# Patient Record
Sex: Male | Born: 2000 | Race: White | Hispanic: No | Marital: Single | Smoking: Never smoker
Health system: Southern US, Community
[De-identification: ages and names within clinical notes are randomized; demographics above are authoritative.]

## PROBLEM LIST (undated history)

## (undated) DIAGNOSIS — F419 Anxiety disorder, unspecified: Secondary | ICD-10-CM

## (undated) DIAGNOSIS — F32A Depression, unspecified: Secondary | ICD-10-CM

## (undated) DIAGNOSIS — J309 Allergic rhinitis, unspecified: Secondary | ICD-10-CM

## (undated) HISTORY — DX: Anxiety disorder, unspecified: F41.9

## (undated) HISTORY — DX: Allergic rhinitis, unspecified: J30.9

## (undated) HISTORY — DX: Depression, unspecified: F32.A

---
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
## 2018-03-24 IMAGING — DX DG ABDOMEN ACUTE W/ 1V CHEST
4 series · 4 of 4 positions shown · non-contrast
Comparison: None.

CLINICAL DATA: Upper abdominal pain.

EXAM:
DG ABDOMEN ACUTE W/ 1V CHEST

[chest pa]
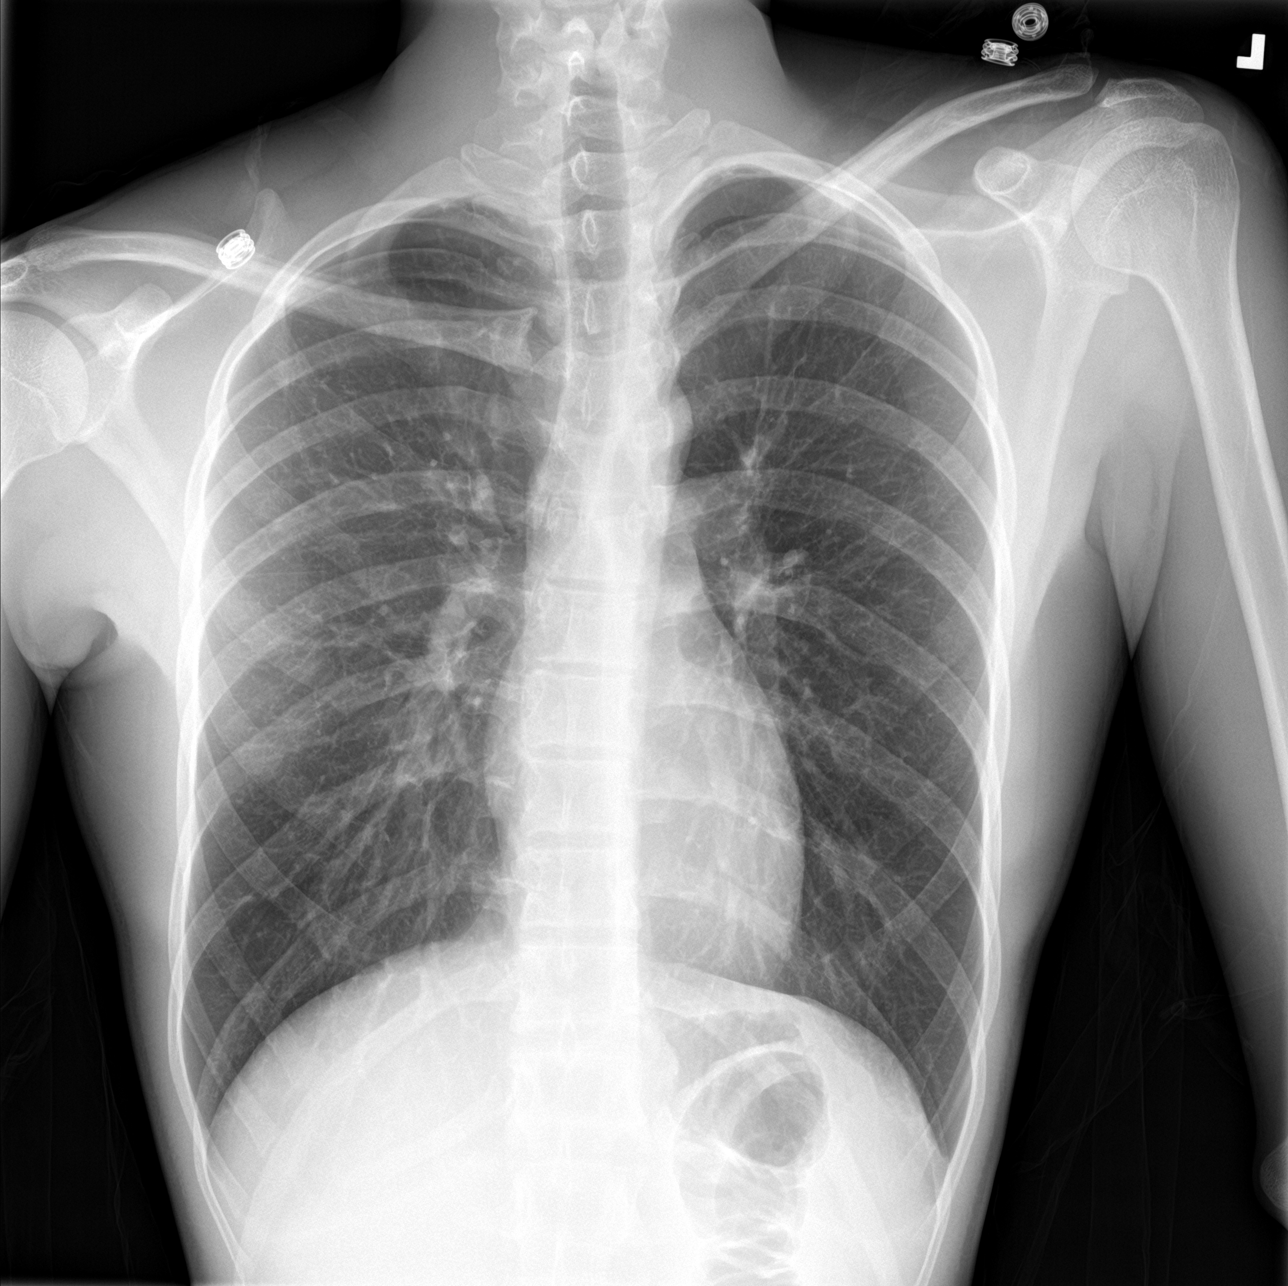

[abdomen erect]
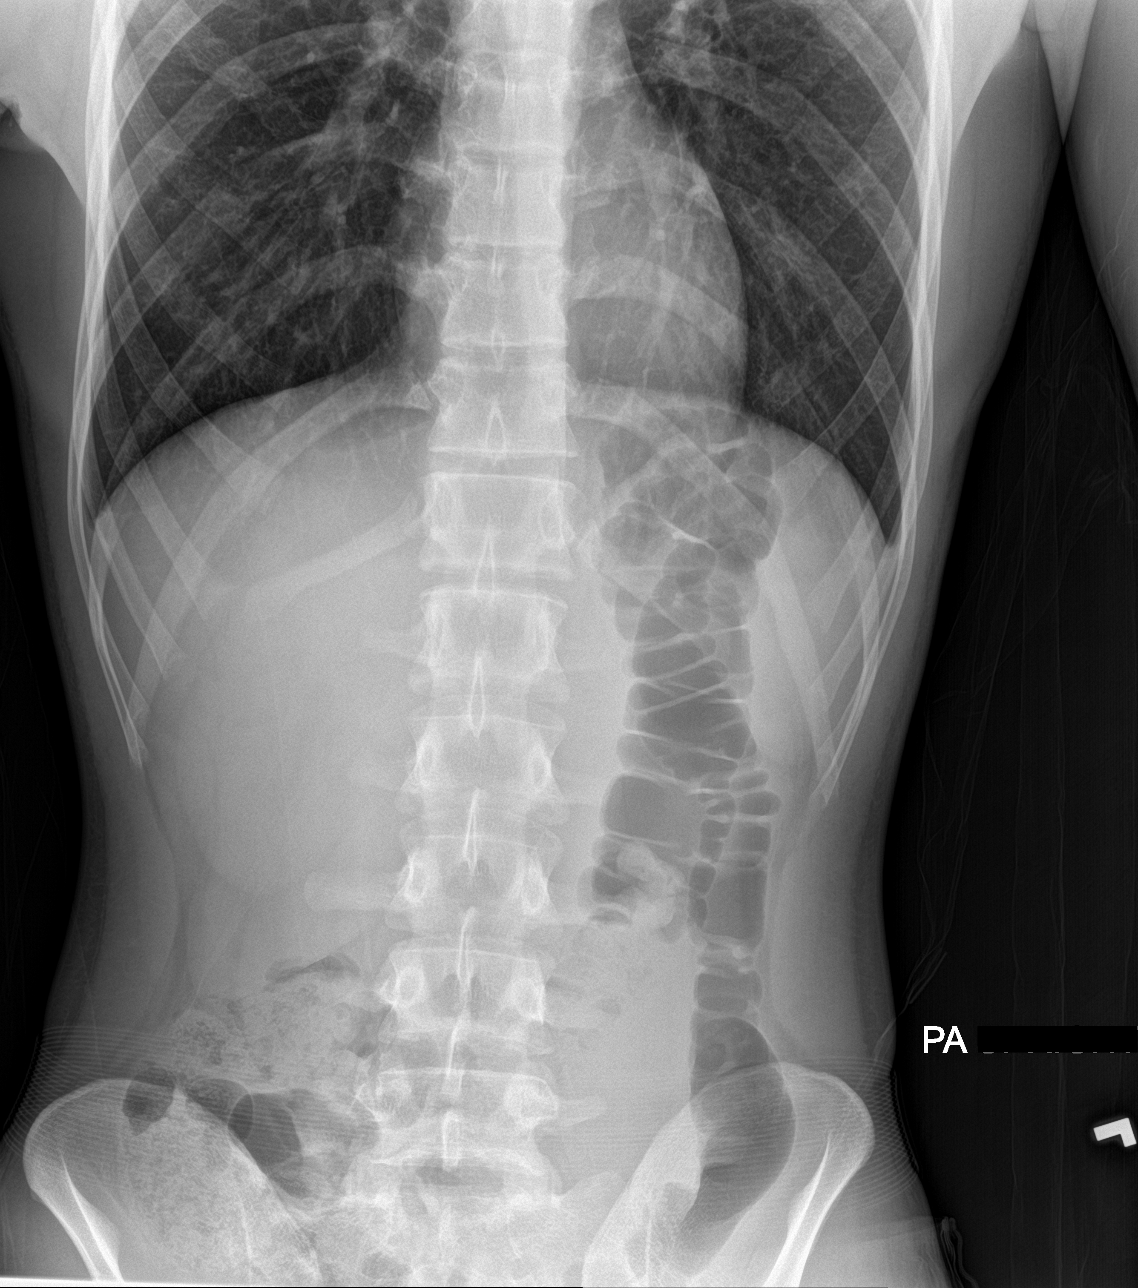

[abdomen supine (1 of 2)]
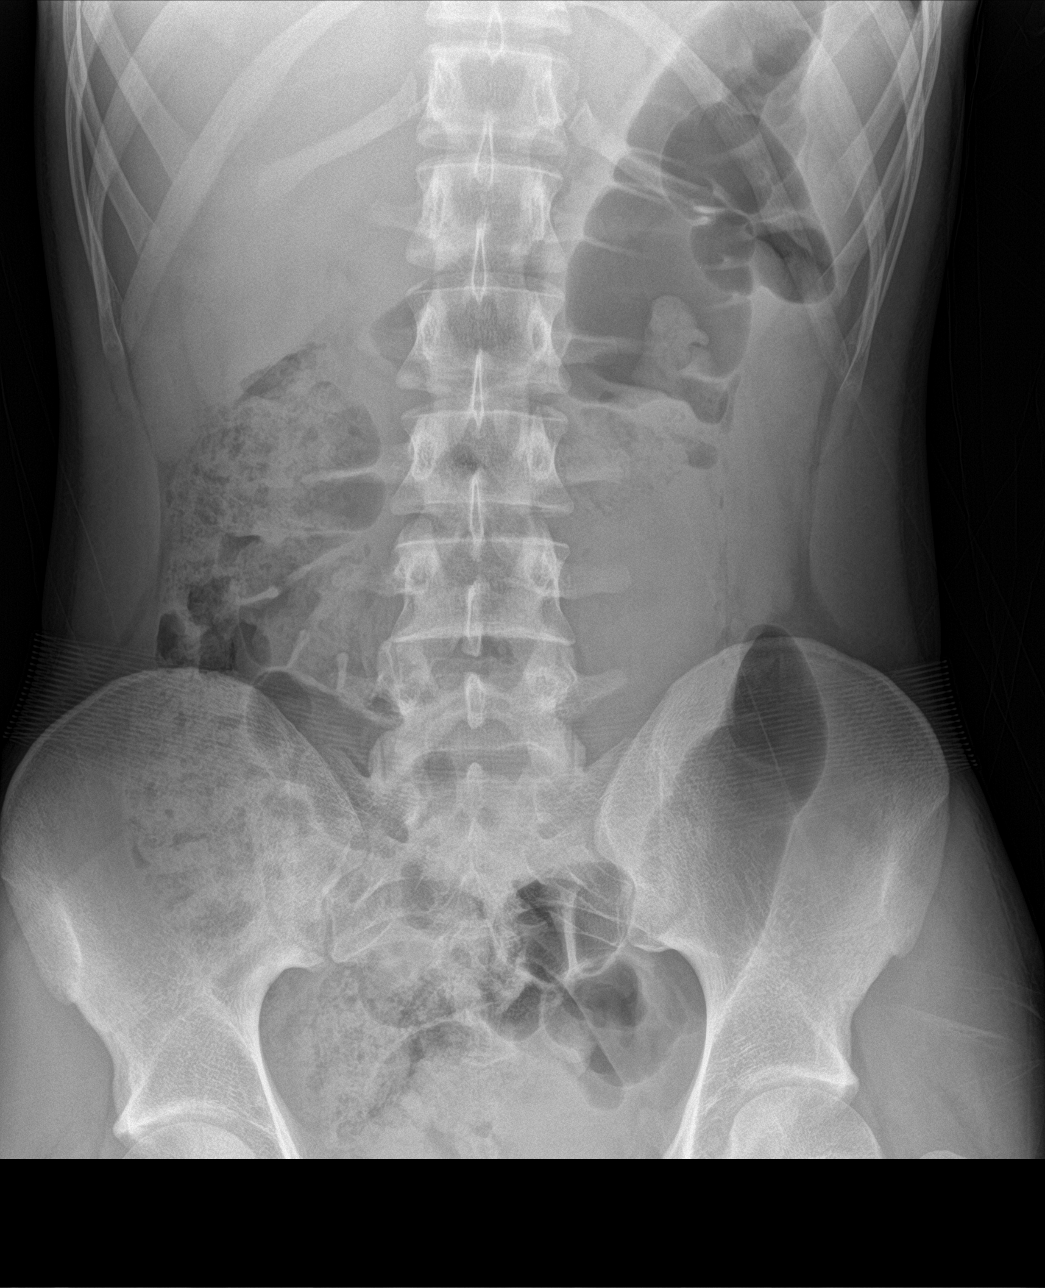

[abdomen supine (2 of 2)]
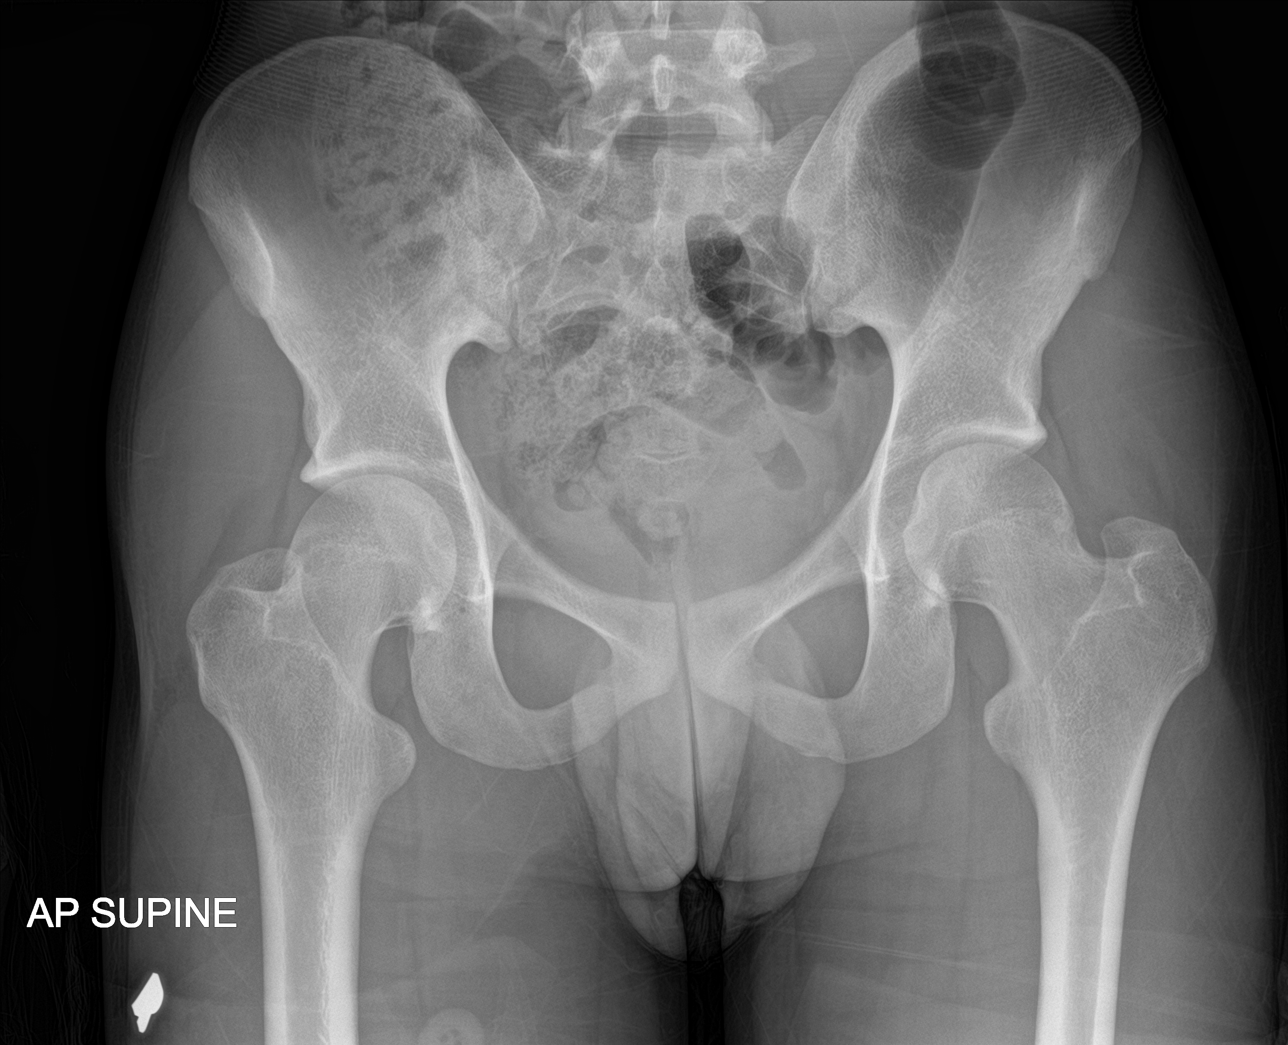

[4 of 4 positions shown; findings below may reference images not displayed]

FINDINGS: The lungs are clear wiithout focal pneumonia, edema, pneumothorax or
pleural effusion. The cardiopericardial silhouette is within normal
limits for size. The visualized bony structures of the thorax are
intact.Upright film shows no evidence for intraperitoneal free air.
No gaseous small bowel dilatation. Prominent stool noted right colon
with mild diffuse gaseous colonic distention.
IMPRESSION: 1. Normal chest x-ray.
2. Prominent stool right colon.  Nonspecific bowel gas pattern.

## 2020-04-13 ENCOUNTER — Encounter: Payer: Self-pay | Admitting: Family Medicine

## 2020-04-13 ENCOUNTER — Other Ambulatory Visit: Payer: Self-pay

## 2020-04-13 ENCOUNTER — Ambulatory Visit: Payer: Medicaid Other | Admitting: Family Medicine

## 2020-04-13 VITALS — BP 134/79 | HR 68 | Temp 97.7°F | Ht 69.0 in | Wt 147.4 lb

## 2020-04-13 DIAGNOSIS — G4700 Insomnia, unspecified: Secondary | ICD-10-CM | POA: Diagnosis not present

## 2020-04-13 DIAGNOSIS — Z7689 Persons encountering health services in other specified circumstances: Secondary | ICD-10-CM

## 2020-04-13 DIAGNOSIS — F321 Major depressive disorder, single episode, moderate: Secondary | ICD-10-CM

## 2020-04-13 MED ORDER — CITALOPRAM HYDROBROMIDE 20 MG PO TABS: 20.0000 mg | ORAL_TABLET | Freq: Every day | ORAL | 5 refills | Status: DC

## 2020-04-13 NOTE — Patient Instructions (Signed)

## 2020-04-13 NOTE — Progress Notes (Signed)
New Patient Office Visit  Subjective:  Patient ID: Randy Huang, male    DOB: 2000/06/24  Age: 19 y.o. MRN: [id number]  CC:  Chief Complaint  Patient presents with  . New Patient (Initial Visit)    HPI LIANDRO THELIN presents to re-establish care and discuss depression.  1. Depression Deaglan reports a history of depression. He reports that he feels like he always has symptoms of depression and has periods where his symptoms are worse. He denies any recent precipitating events. He has tried lexapro a few years ago without improvement. He has not tried medication since. He is interested in trying medication again. He is not interested in therapy at this time. He also reports that he does not sleep well 3-4 nights a week. On these night he gets about 3-4 hours of sleep. Other nights he sleeps well. He has tried melatonin in the past. He has not tried any remedies recently.   Depression screen [facility] 2/9 04/13/2020 07/04/2016 06/18/2016  Decreased Interest 2 0 0  Down, Depressed, Hopeless 1 0 0  PHQ - 2 Score 3 0 0  Altered sleeping 3 0 0  Tired, decreased energy 1 0 0  Change in appetite 3 0 0  Feeling bad or failure about yourself  2 0 0  Trouble concentrating 0 0 0  Moving slowly or fidgety/restless 2 0 0  Suicidal thoughts 0 0 0  PHQ-9 Score 14 0 0  Difficult doing work/chores Somewhat difficult - -      Past Medical History:  Diagnosis Date  . Allergic rhinitis 12/05/2012  . Anxiety   . Depression     History reviewed. No pertinent surgical history.  Family History  Problem Relation Age of Onset  . Hyperlipidemia Mother        mild  . Migraines Mother   . Diabetes Father   . Hypertension Father   . Hyperlipidemia Father   . Sleep apnea Father   . Cancer Father        cancer  . Healthy Brother   . Thyroid disease Maternal Grandmother   . Hyperlipidemia Maternal Grandmother   . Hypertension Maternal Grandmother   . Diabetes Maternal Grandfather   . Cancer  Maternal Grandfather        skin  . Hypertension Maternal Grandfather   . Heart disease Maternal Grandfather   . Cancer Paternal Grandmother        breast  . Healthy Brother   . Migraines Brother     Social History   Socioeconomic History  . Marital status: Single    Spouse name: Not on file  . Number of children: 0  . Years of education: 64  . Highest education level: High school graduate  Occupational History  . Occupation: unemployed  Tobacco Use  . Smoking status: Passive Smoke Exposure - Never Smoker  . Smokeless tobacco: Never Used  . Tobacco comment: Mom smokes outside  Vaping Use  . Vaping Use: Never used  Substance and Sexual Activity  . Alcohol use: Not Currently  . Drug use: No  . Sexual activity: Not Currently  Other Topics Concern  . Not on file  Social History Narrative   He lives with his mother and stepfather and is currently unemployed   Social Determinants of Radio broadcast assistant Strain:   . Difficulty of Paying Living Expenses: Not on file  Food Insecurity:   . Worried About Charity fundraiser in the Last Year:  Not on file  . Ran Out of Food in the Last Year: Not on file  Transportation Needs:   . Lack of Transportation (Medical): Not on file  . Lack of Transportation (Non-Medical): Not on file  Physical Activity:   . Days of Exercise per Week: Not on file  . Minutes of Exercise per Session: Not on file  Stress:   . Feeling of Stress : Not on file  Social Connections:   . Frequency of Communication with Friends and Family: Not on file  . Frequency of Social Gatherings with Friends and Family: Not on file  . Attends Religious Services: Not on file  . Active Member of Clubs or Organizations: Not on file  . Attends Archivist Meetings: Not on file  . Marital Status: Not on file  Intimate Partner Violence:   . Fear of Current or Ex-Partner: Not on file  . Emotionally Abused: Not on file  . Physically Abused: Not on file  .  Sexually Abused: Not on file    ROS Review of Systems Negative unless specially indicated above in HPI.  Objective:   Today's Vitals: BP 134/79   Pulse 68   Temp 97.7 F (36.5 C) (Temporal)   Ht _0  (1.753 m)   Wt 147 lb 6 oz (66.8 kg)   BMI 21.76 kg/m   Physical Exam Vitals and nursing note reviewed.  Constitutional:      General: He is not in acute distress.    Appearance: Normal appearance. He is normal weight. He is not ill-appearing, toxic-appearing or diaphoretic.  Cardiovascular:     Rate and Rhythm: Normal rate and regular rhythm.     Heart sounds: Normal heart sounds. No murmur heard.   Pulmonary:     Effort: Pulmonary effort is normal. No respiratory distress.     Breath sounds: Normal breath sounds.  Musculoskeletal:     Right lower leg: No edema.     Left lower leg: No edema.  Skin:    General: Skin is warm and dry.  Neurological:     General: No focal deficit present.     Mental Status: He is alert and oriented to person, place, and time.     Motor: No weakness.     Gait: Gait normal.  Psychiatric:        Attention and Perception: Attention normal.        Mood and Affect: Affect is blunt.        Speech: Speech normal.        Behavior: Behavior normal.        Thought Content: Thought content normal.        Judgment: Judgment normal.     Assessment & Plan:  Dearies was seen today for new patient (initial visit).  Diagnoses and all orders for this visit:  Moderate major depression ([place]) Start celexa 20 mg daily. Declined psychology referral today. Handout provided with crisis hotline number. Follow up in 1 month, sooner if needed. -     citalopram (CELEXA) 20 MG tablet; Take 1 tablet (20 mg total) by mouth daily. -     CMP14+EGFR -     CBC with Differential/Platelet -     Thyroid Panel With TSH  Insomnia, unspecified type Sleep hygiene and OTC sleep aids. Follow up if no improvement.   Encounter to establish care -     CMP14+EGFR -      CBC with Differential/Platelet -     Thyroid Panel  With TSH  Follow-up: 1 month for depression, insomnia.  The patient indicates understanding of these issues and agrees with the plan.   Gwenlyn Perking, FNP

## 2020-04-14 LAB — CBC WITH DIFFERENTIAL/PLATELET
Basophils Absolute: 0.1 10*3/uL (ref 0.0–0.2)
Basos: 1 %
EOS (ABSOLUTE): 0.6 10*3/uL — ABNORMAL HIGH (ref 0.0–0.4)
Eos: 6 %
Hematocrit: 49.2 % (ref 37.5–51.0)
Hemoglobin: 16.7 g/dL (ref 13.0–17.7)
Immature Grans (Abs): 0.1 10*3/uL (ref 0.0–0.1)
Immature Granulocytes: 1 %
Lymphocytes Absolute: 4.3 10*3/uL — ABNORMAL HIGH (ref 0.7–3.1)
Lymphs: 41 %
MCH: 29.8 pg (ref 26.6–33.0)
MCHC: 33.9 g/dL (ref 31.5–35.7)
MCV: 88 fL (ref 79–97)
Monocytes Absolute: 0.9 10*3/uL (ref 0.1–0.9)
Monocytes: 9 %
Neutrophils Absolute: 4.5 10*3/uL (ref 1.4–7.0)
Neutrophils: 42 %
Platelets: 118 10*3/uL — ABNORMAL LOW (ref 150–450)
RBC: 5.61 x10E6/uL (ref 4.14–5.80)
RDW: 12.4 % (ref 11.6–15.4)
WBC: 10.4 10*3/uL (ref 3.4–10.8)

## 2020-04-14 LAB — CMP14+EGFR
ALT: 15 IU/L (ref 0–44)
AST: 16 IU/L (ref 0–40)
Albumin/Globulin Ratio: 1.9 (ref 1.2–2.2)
Albumin: 5.1 g/dL (ref 4.1–5.2)
Alkaline Phosphatase: 72 IU/L (ref 44–121)
BUN/Creatinine Ratio: 9 (ref 9–20)
BUN: 8 mg/dL (ref 6–20)
Bilirubin Total: 0.4 mg/dL (ref 0.0–1.2)
CO2: 21 mmol/L (ref 20–29)
Calcium: 9.9 mg/dL (ref 8.7–10.2)
Chloride: 102 mmol/L (ref 96–106)
Creatinine, Ser: 0.9 mg/dL (ref 0.76–1.27)
GFR calc Af Amer: 141 mL/min/{1.73_m2} (ref >59)
GFR calc non Af Amer: 122 mL/min/{1.73_m2} (ref >59)
Globulin, Total: 2.7 g/dL (ref 1.5–4.5)
Glucose: 92 mg/dL (ref 65–99)
Potassium: 3.9 mmol/L (ref 3.5–5.2)
Sodium: 141 mmol/L (ref 134–144)
Total Protein: 7.8 g/dL (ref 6.0–8.5)

## 2020-04-14 LAB — THYROID PANEL WITH TSH
Free Thyroxine Index: 2.9 (ref 1.2–4.9)
T3 Uptake Ratio: 26 % (ref 24–39)
T4, Total: 11.3 ug/dL (ref 4.5–12.0)
TSH: 3.87 u[IU]/mL (ref 0.450–4.500)

## 2020-05-11 ENCOUNTER — Ambulatory Visit: Payer: Self-pay | Admitting: Family Medicine

## 2020-05-23 ENCOUNTER — Ambulatory Visit: Payer: Self-pay | Admitting: Family Medicine

## 2020-10-17 ENCOUNTER — Other Ambulatory Visit: Payer: Self-pay | Admitting: Family Medicine

## 2020-10-17 DIAGNOSIS — F321 Major depressive disorder, single episode, moderate: Secondary | ICD-10-CM

## 2020-10-18 ENCOUNTER — Other Ambulatory Visit: Payer: Self-pay | Admitting: Family Medicine

## 2020-10-18 DIAGNOSIS — F321 Major depressive disorder, single episode, moderate: Secondary | ICD-10-CM

## 2020-10-19 ENCOUNTER — Other Ambulatory Visit: Payer: Self-pay | Admitting: Family Medicine

## 2020-10-19 DIAGNOSIS — F321 Major depressive disorder, single episode, moderate: Secondary | ICD-10-CM

## 2020-11-20 ENCOUNTER — Other Ambulatory Visit: Payer: Self-pay | Admitting: Family Medicine

## 2020-11-20 DIAGNOSIS — F321 Major depressive disorder, single episode, moderate: Secondary | ICD-10-CM

## 2021-01-25 ENCOUNTER — Encounter: Payer: Self-pay | Admitting: Family Medicine

## 2021-01-25 ENCOUNTER — Other Ambulatory Visit: Payer: Self-pay

## 2021-01-25 ENCOUNTER — Ambulatory Visit: Payer: Medicaid Other | Admitting: Family Medicine

## 2021-01-25 DIAGNOSIS — F321 Major depressive disorder, single episode, moderate: Secondary | ICD-10-CM | POA: Diagnosis not present

## 2021-01-25 MED ORDER — PAROXETINE HCL 20 MG PO TABS: 20.0000 mg | ORAL_TABLET | Freq: Every day | ORAL | 1 refills | Status: DC

## 2021-01-25 NOTE — Progress Notes (Signed)
BP 116/74   Pulse 80   Ht 5\' 9"  (1.753 m)   Wt 165 lb (74.8 kg)   SpO2 95%   BMI 24.37 kg/m    Subjective:   Patient ID: , male    DOB: Aug 17, 2000, 20 y.o.   MRN: [id number]  HPI: Randy Huang is a 20 y.o. male presenting on 01/25/2021 for Medical Management of Chronic Issues (Taking Celexa qd. Would like something stronger. Anxiety and depression is worse), Anxiety, and Depression   HPI Depression recheck Patient is coming in for anxiety depression.  He says he was on citalopram but was not doing as well and he just was getting more depressed and more anxious and so he stopped it.  He says it was also affecting his appetite.  Per mother they did lose their grandma this year and that has been affecting some things, she passed away in June 30, 2022 of this year.  He is also mostly at home sleeping and playing video games all day.  He does admit to passing suicidal thoughts but no action plan and says he has not gotten anywhere near that. Depression screen [facility] 2/9 01/25/2021 04/13/2020 07/04/2016 06/18/2016 02/13/2016  Decreased Interest 2 2 0 0 0  Down, Depressed, Hopeless 3 1 0 0 0  PHQ - 2 Score 5 3 0 0 0  Altered sleeping 3 3 0 0 -  Tired, decreased energy 2 1 0 0 -  Change in appetite 3 3 0 0 -  Feeling bad or failure about yourself  2 2 0 0 -  Trouble concentrating 0 0 0 0 -  Moving slowly or fidgety/restless 0 2 0 0 -  Suicidal thoughts 1 0 0 0 -  PHQ-9 Score 16 14 0 0 -  Difficult doing work/chores - Somewhat difficult - - -     Relevant past medical, surgical, family and social history reviewed and updated as indicated. Interim medical history since our last visit reviewed. Allergies and medications reviewed and updated.  Review of Systems  Constitutional:  Negative for chills and fever.  Eyes:  Negative for visual disturbance.  Respiratory:  Negative for shortness of breath and wheezing.   Cardiovascular:  Negative for chest pain and leg swelling.  Skin:   Negative for rash.  Neurological:  Negative for dizziness, weakness and light-headedness.  Psychiatric/Behavioral:  Positive for decreased concentration, dysphoric mood and suicidal ideas. Negative for self-injury and sleep disturbance. The patient is nervous/anxious.   All other systems reviewed and are negative.  Per HPI unless specifically indicated above   Allergies as of 01/25/2021   No Known Allergies      Medication List        Accurate as of January 25, 2021  2:26 PM. If you have any questions, ask your nurse or doctor.          STOP taking these medications    citalopram 20 MG tablet Commonly known as: CELEXA Stopped by: January 27, 2021 Mikiya Nebergall, MD       TAKE these medications    PARoxetine 20 MG tablet Commonly known as: Paxil Take 1 tablet (20 mg total) by mouth daily. Started by: Elige Radon Rehana Uncapher, MD         Objective:   BP 116/74   Pulse 80   Ht 5\' 9"  (1.753 m)   Wt 165 lb (74.8 kg)   SpO2 95%   BMI 24.37 kg/m   Wt Readings from Last 3 Encounters:  01/25/21 165  lb (74.8 kg)  04/13/20 147 lb 6 oz (66.8 kg)  07/06/16 115 lb (52.2 kg) (4 %, Z= -1.75)*   * Growth percentiles are based on CDC (Boys, 2-20 Years) data.    Physical Exam Vitals and nursing note reviewed.  Constitutional:      General: He is not in acute distress.    Appearance: He is well-developed. He is not diaphoretic.  Eyes:     General: No scleral icterus.    Conjunctiva/sclera: Conjunctivae normal.  Neck:     Thyroid: No thyromegaly.  Neurological:     Mental Status: He is alert and oriented to person, place, and time.     Coordination: Coordination normal.  Psychiatric:        Mood and Affect: Mood is anxious and depressed.        Behavior: Behavior normal.        Thought Content: Thought content does not include suicidal ideation. Thought content does not include suicidal plan.      Assessment & Plan:   Problem List Items Addressed This Visit       Other    Moderate major depression (HCC)   Relevant Medications   PARoxetine (PAXIL) 20 MG tablet    We will switch to Paxil and see if it does better, start low-dose, have him come back in 4 to 5 weeks. Follow up plan: Return if symptoms worsen or fail to improve, for 4 to 5-week anxiety depression recheck.  Counseling provided for all of the vaccine components No orders of the defined types were placed in this encounter.   Arville Care, MD [facility] 01/25/2021, 2:26 PM

## 2021-02-22 ENCOUNTER — Ambulatory Visit: Payer: Medicaid Other | Admitting: Family Medicine

## 2021-03-15 ENCOUNTER — Ambulatory Visit: Payer: Medicaid Other | Admitting: Family Medicine

## 2021-03-15 ENCOUNTER — Other Ambulatory Visit: Payer: Self-pay

## 2021-03-15 ENCOUNTER — Encounter: Payer: Self-pay | Admitting: Family Medicine

## 2021-03-15 VITALS — BP 127/79 | HR 108 | Ht 69.0 in | Wt 157.0 lb

## 2021-03-15 DIAGNOSIS — F321 Major depressive disorder, single episode, moderate: Secondary | ICD-10-CM

## 2021-03-15 DIAGNOSIS — Z23 Encounter for immunization: Secondary | ICD-10-CM

## 2021-03-15 MED ORDER — HYDROXYZINE PAMOATE 25 MG PO CAPS: 25.0000 mg | ORAL_CAPSULE | Freq: Three times a day (TID) | ORAL | 3 refills | Status: DC | PRN

## 2021-03-15 MED ORDER — PAROXETINE HCL 40 MG PO TABS: 40.0000 mg | ORAL_TABLET | Freq: Every day | ORAL | 1 refills | Status: DC

## 2021-03-15 NOTE — Addendum Note (Signed)
Addended by: Arville Care on: 03/15/2021 01:56 PM   Modules accepted: Orders

## 2021-03-15 NOTE — Progress Notes (Signed)
BP 127/79   Pulse (!) 108   Ht 5\' 9"  (1.753 m)   Wt 157 lb (71.2 kg)   SpO2 94%   BMI 23.18 kg/m    Subjective:   Patient ID: , male    DOB: [birth year], 20 y.o.   MRN: [id number]  HPI: Randy Huang is a 20 y.o. male presenting on 03/15/2021 for Medical Management of Chronic Issues and Depression   HPI Depression recheck Patient is currently taking Paxil 20 mg daily.  Patient just has not been doing well, infection been out of his medicine for 2 weeks and is doing worse but he was doing better before that.  He does say he has some passing suicidal ideations but no actual thoughts of a plan to hurt himself.  He says he is not staying asleep, he is waking up and his mind is racing multiple times a night. Depression screen [facility] 2/9 03/15/2021 01/25/2021 04/13/2020 07/04/2016 06/18/2016  Decreased Interest 2 2 2  0 0  Down, Depressed, Hopeless 3 3 1  0 0  PHQ - 2 Score 5 5 3  0 0  Altered sleeping 3 3 3  0 0  Tired, decreased energy 2 2 1  0 0  Change in appetite 3 3 3  0 0  Feeling bad or failure about yourself  1 2 2  0 0  Trouble concentrating 3 0 0 0 0  Moving slowly or fidgety/restless 0 0 2 0 0  Suicidal thoughts 2 1 0 0 0  PHQ-9 Score 19 16 14  0 0  Difficult doing work/chores - - Somewhat difficult - -     Relevant past medical, surgical, family and social history reviewed and updated as indicated. Interim medical history since our last visit reviewed. Allergies and medications reviewed and updated.  Review of Systems  Constitutional:  Negative for chills and fever.  Eyes:  Negative for visual disturbance.  Respiratory:  Negative for shortness of breath and wheezing.   Cardiovascular:  Negative for chest pain and leg swelling.  Musculoskeletal:  Negative for back pain and gait problem.  Skin:  Negative for rash.  Neurological:  Negative for dizziness, weakness and light-headedness.  Psychiatric/Behavioral:  Positive for dysphoric mood and sleep disturbance.  Negative for self-injury and suicidal ideas. The patient is nervous/anxious.   All other systems reviewed and are negative.  Per HPI unless specifically indicated above   Allergies as of 03/15/2021   No Known Allergies      Medication List        Accurate as of March 15, 2021  1:50 PM. If you have any questions, ask your nurse or doctor.          PARoxetine 40 MG tablet Commonly known as: Paxil Take 1 tablet (40 mg total) by mouth daily. What changed:  medication strength how much to take Changed by: [name], MD         Objective:   BP 127/79   Pulse (!) 108   Ht 5\' 9"  (1.753 m)   Wt 157 lb (71.2 kg)   SpO2 94%   BMI 23.18 kg/m   Wt Readings from Last 3 Encounters:  03/15/21 157 lb (71.2 kg)  01/25/21 165 lb (74.8 kg)  04/13/20 147 lb 6 oz (66.8 kg)    Physical Exam Vitals and nursing note reviewed.  Constitutional:      General: He is not in acute distress.    Appearance: He is well-developed. He is not diaphoretic.  Eyes:  General: No scleral icterus.    Conjunctiva/sclera: Conjunctivae normal.  Musculoskeletal:        General: Normal range of motion.  Neurological:     Mental Status: He is alert and oriented to person, place, and time.     Coordination: Coordination normal.  Psychiatric:        Mood and Affect: Mood is anxious and depressed.        Behavior: Behavior normal.        Thought Content: Thought content does not include suicidal ideation. Thought content does not include suicidal plan.      Assessment & Plan:   Problem List Items Addressed This Visit       Other   Moderate major depression (HCC) - Primary   Relevant Medications   PARoxetine (PAXIL) 40 MG tablet   Other Visit Diagnoses     Need for immunization against influenza       Relevant Orders   Flu Vaccine QUAD 33mo+IM (Fluarix, Fluzone & Alfiuria Quad PF) (Completed)       Increased Paxil to 40 mg Follow up plan: No follow-ups on  file.  Counseling provided for all of the vaccine components Orders Placed This Encounter  Procedures   Flu Vaccine QUAD 75mo+IM (Fluarix, Fluzone & Alfiuria Quad PF)    Arville Care, MD [facility] 03/15/2021, 1:50 PM

## 2021-03-31 ENCOUNTER — Ambulatory Visit: Payer: Medicaid Other | Admitting: Family Medicine

## 2021-04-05 ENCOUNTER — Encounter: Payer: Self-pay | Admitting: Family Medicine

## 2021-04-24 ENCOUNTER — Ambulatory Visit: Payer: Medicaid Other | Admitting: Family Medicine

## 2021-05-31 ENCOUNTER — Ambulatory Visit: Payer: Medicaid Other | Admitting: Family Medicine

## 2021-05-31 ENCOUNTER — Encounter: Payer: Self-pay | Admitting: Family Medicine

## 2021-05-31 VITALS — BP 133/79 | HR 86 | Ht 69.0 in | Wt 177.0 lb

## 2021-05-31 DIAGNOSIS — F321 Major depressive disorder, single episode, moderate: Secondary | ICD-10-CM

## 2021-05-31 DIAGNOSIS — Z23 Encounter for immunization: Secondary | ICD-10-CM

## 2021-05-31 MED ORDER — BUPROPION HCL ER (XL) 150 MG PO TB24: 150.0000 mg | ORAL_TABLET | Freq: Every day | ORAL | 1 refills | Status: DC

## 2021-05-31 NOTE — Progress Notes (Signed)
BP 133/79    Pulse 86    Ht 5\' 9"  (1.753 m)    Wt 177 lb (80.3 kg)    SpO2 96%    BMI 26.14 kg/m    Subjective:   Patient ID: , male    DOB: 2001-02-28, 21 y.o.   MRN: [id number]  HPI: Randy Huang is a 21 y.o. male presenting on 05/31/2021 for Medical Management of Chronic Issues and Depression (Meds are not helping and they cause itching)   HPI Depression Patient says his depression is not doing as well and is having a lot of shakes.  He says the Paxil is really not working for him.  He did help with his anxiety but does not depression patient denies suicidal plans but does have some passing thoughts of hurting himself but he says he has no action plan and no plan to do anything.  We did discuss that and gave him information about the suicide hotline and calling 911 or calling our after-hours clinic or calling during the daytime. Depression screen [facility] 2/9 05/31/2021 03/15/2021 01/25/2021 04/13/2020 07/04/2016  Decreased Interest 3 2 2 2  0  Down, Depressed, Hopeless 2 3 3 1  0  PHQ - 2 Score 5 5 5 3  0  Altered sleeping 2 3 3 3  0  Tired, decreased energy 3 2 2 1  0  Change in appetite 3 3 3 3  0  Feeling bad or failure about yourself  3 1 2 2  0  Trouble concentrating 0 3 0 0 0  Moving slowly or fidgety/restless 0 0 0 2 0  Suicidal thoughts 2 2 1  0 0  PHQ-9 Score 18 19 16 14  0  Difficult doing work/chores - - - Somewhat difficult -     Relevant past medical, surgical, family and social history reviewed and updated as indicated. Interim medical history since our last visit reviewed. Allergies and medications reviewed and updated.  Review of Systems  Constitutional:  Negative for chills and fever.  Skin:  Negative for rash.  Psychiatric/Behavioral:  Positive for dysphoric mood and sleep disturbance. Negative for decreased concentration, self-injury and suicidal ideas. The patient is nervous/anxious.   All other systems reviewed and are negative.  Per HPI unless  specifically indicated above   Allergies as of 05/31/2021   No Known Allergies      Medication List        Accurate as of May 31, 2021  2:00 PM. If you have any questions, ask your nurse or doctor.          hydrOXYzine 25 MG capsule Commonly known as: VISTARIL Take 1 capsule (25 mg total) by mouth every 8 (eight) hours as needed.   PARoxetine 40 MG tablet Commonly known as: Paxil Take 1 tablet (40 mg total) by mouth daily.         Objective:   BP 133/79    Pulse 86    Ht 5\' 9"  (1.753 m)    Wt 177 lb (80.3 kg)    SpO2 96%    BMI 26.14 kg/m   Wt Readings from Last 3 Encounters:  05/31/21 177 lb (80.3 kg)  03/15/21 157 lb (71.2 kg)  01/25/21 165 lb (74.8 kg)    Physical Exam Vitals and nursing note reviewed.  Constitutional:      General: He is not in acute distress.    Appearance: He is well-developed. He is not diaphoretic.  Eyes:     General: No scleral icterus.  Conjunctiva/sclera: Conjunctivae normal.  Neurological:     Mental Status: He is alert and oriented to person, place, and time.     Coordination: Coordination normal.  Psychiatric:        Mood and Affect: Mood is anxious and depressed.        Behavior: Behavior normal.        Thought Content: Thought content does not include suicidal ideation. Thought content does not include suicidal plan.      Assessment & Plan:   Problem List Items Addressed This Visit       Other   Moderate major depression (HCC)   Other Visit Diagnoses     Need for Tdap vaccination    -  Primary   Relevant Orders   Tdap vaccine greater than or equal to 7yo IM       Instructed patient to take half a tablet of Paxil for 1 week, go ahead and start the Wellbutrin once a day and then taper off. Follow up plan: Return if symptoms worsen or fail to improve, for 3 to 4-week anxiety and depression.  Counseling provided for all of the vaccine components Orders Placed This Encounter  Procedures   Tdap vaccine  greater than or equal to 7yo IM    Arville Care, MD [facility] 05/31/2021, 2:00 PM

## 2021-06-29 ENCOUNTER — Ambulatory Visit: Payer: Medicaid Other | Admitting: Family Medicine

## 2023-12-31 ENCOUNTER — Ambulatory Visit: Admitting: Family Medicine

## 2023-12-31 ENCOUNTER — Encounter: Payer: Self-pay | Admitting: Family Medicine

## 2023-12-31 VITALS — BP 122/79 | HR 90 | Temp 97.9°F | Ht 69.0 in | Wt 171.2 lb

## 2023-12-31 DIAGNOSIS — L239 Allergic contact dermatitis, unspecified cause: Secondary | ICD-10-CM

## 2023-12-31 DIAGNOSIS — F321 Major depressive disorder, single episode, moderate: Secondary | ICD-10-CM

## 2023-12-31 MED ORDER — PREDNISONE 20 MG PO TABS: 40.0000 mg | ORAL_TABLET | Freq: Every day | ORAL | 0 refills | Status: AC

## 2023-12-31 MED ORDER — HYDROXYZINE PAMOATE 25 MG PO CAPS: 25.0000 mg | ORAL_CAPSULE | Freq: Three times a day (TID) | ORAL | 0 refills | Status: AC | PRN

## 2023-12-31 MED ORDER — CETIRIZINE HCL 10 MG PO TABS: 10.0000 mg | ORAL_TABLET | Freq: Every day | ORAL | 0 refills | Status: AC

## 2023-12-31 MED ORDER — FAMOTIDINE 40 MG PO TABS: 40.0000 mg | ORAL_TABLET | Freq: Two times a day (BID) | ORAL | 0 refills | Status: AC

## 2023-12-31 NOTE — Progress Notes (Signed)
 Subjective:  Patient ID: Randy Huang, male    DOB: 02/24/2001, 23 y.o.   MRN: [id number]  Patient Care Team: Dettinger, Fonda DELENA, MD as PCP - General (Family Medicine)   Chief Complaint:  Rash (Bilateral arms, bilateral thighs and abdomen x2 weeks.  Patient states that it itches. )   HPI: Randy Huang is a 23 y.o. male presenting on 12/31/2023 for Rash (Bilateral arms, bilateral thighs and abdomen x2 weeks.  Patient states that it itches. )   Randy Huang is a 23 year old male who presents with a rash that has been present for two weeks.  He has had a rash for approximately two weeks. No recent changes in medications, soaps, detergents, fragrances, or exposure to new animals. He denies any bites or stings.  He has been using a generic brand of cortisol cream, similar to Benadryl cream, which has provided some relief. He has not been taking any oral antihistamines like Claritin.  No trouble breathing, tongue swelling, wheezing, or tick bites. He confirms the absence of new deodorants and continues to use the same laundry detergent, Gain, without any changes.  He has a history of anxiety and depression but has stopped taking his medications for these conditions. Feels his symptoms are well managed without medications.          Relevant past medical, surgical, family, and social history reviewed and updated as indicated.  Allergies and medications reviewed and updated. Data reviewed: Chart in Epic.   Past Medical History:  Diagnosis Date   Allergic rhinitis 12/05/2012   Anxiety    Depression     History reviewed. No pertinent surgical history.  Social History   Socioeconomic History   Marital status: Single    Spouse name: Not on file   Number of children: 0   Years of education: 12   Highest education level: High school graduate  Occupational History   Occupation: unemployed  Tobacco Use   Smoking status: Never    Passive exposure: Yes   Smokeless  tobacco: Never   Tobacco comments:    Mom smokes outside  Vaping Use   Vaping status: Never Used  Substance and Sexual Activity   Alcohol use: Not Currently   Drug use: No   Sexual activity: Not Currently  Other Topics Concern   Not on file  Social History Narrative   He lives with his mother and stepfather and is currently unemployed   Social Drivers of Corporate investment banker Strain: Not on file  Food Insecurity: Not on file  Transportation Needs: Not on file  Physical Activity: Not on file  Stress: Not on file  Social Connections: Not on file  Intimate Partner Violence: Not on file    Outpatient Encounter Medications as of 12/31/2023  Medication Sig   [DISCONTINUED] buPROPion  (WELLBUTRIN  XL) 150 MG 24 hr tablet Take 1 tablet (150 mg total) by mouth daily.   [DISCONTINUED] hydrOXYzine  (VISTARIL ) 25 MG capsule Take 1 capsule (25 mg total) by mouth every 8 (eight) hours as needed.   [DISCONTINUED] PARoxetine  (PAXIL ) 40 MG tablet Take 1 tablet (40 mg total) by mouth daily.   cetirizine  (ZYRTEC ) 10 MG tablet Take 1 tablet (10 mg total) by mouth at bedtime.   famotidine  (PEPCID ) 40 MG tablet Take 1 tablet (40 mg total) by mouth 2 (two) times daily for 14 days.   hydrOXYzine  (VISTARIL ) 25 MG capsule Take 1 capsule (25 mg total) by mouth every  8 (eight) hours as needed for itching.   predniSONE  (DELTASONE ) 20 MG tablet Take 2 tablets (40 mg total) by mouth daily with breakfast for 5 days.   No facility-administered encounter medications on file as of 12/31/2023.    No Known Allergies  Pertinent ROS per HPI, otherwise unremarkable      Objective:  BP 122/79   Pulse 90   Temp 97.9 F (36.6 C)   Ht 5' 9 (1.753 m)   Wt 171 lb 3.2 oz (77.7 kg)   SpO2 95%   BMI 25.28 kg/m    Wt Readings from Last 3 Encounters:  12/31/23 171 lb 3.2 oz (77.7 kg)  05/31/21 177 lb (80.3 kg)  03/15/21 157 lb (71.2 kg)    Physical Exam Vitals and nursing note reviewed.   Constitutional:      General: He is not in acute distress.    Appearance: Normal appearance. He is normal weight. He is not ill-appearing, toxic-appearing or diaphoretic.  HENT:     Head: Normocephalic and atraumatic.     Right Ear: Hearing normal.     Left Ear: Hearing normal.     Nose: Nose normal.     Mouth/Throat:     Lips: Pink.     Mouth: Mucous membranes are moist. No angioedema.     Pharynx: Oropharynx is clear.  Eyes:     Pupils: Pupils are equal, round, and reactive to light.  Cardiovascular:     Rate and Rhythm: Normal rate and regular rhythm.     Heart sounds: Normal heart sounds. No murmur heard.    No friction rub. No gallop.  Pulmonary:     Effort: Pulmonary effort is normal. No respiratory distress.     Breath sounds: Normal breath sounds. No stridor. No wheezing, rhonchi or rales.  Abdominal:     General: Bowel sounds are normal.     Palpations: Abdomen is soft.  Musculoskeletal:        General: Normal range of motion.     Cervical back: Neck supple.     Right lower leg: No edema.     Left lower leg: No edema.  Skin:    General: Skin is warm and dry.     Capillary Refill: Capillary refill takes less than 2 seconds.     Findings: Erythema and rash present. Rash is papular. Rash is not crusting, macular, nodular, purpuric, pustular, scaling, urticarial or vesicular.     Nails: There is no clubbing.      Neurological:     General: No focal deficit present.     Mental Status: He is alert and oriented to person, place, and time.     Results for orders placed or performed in visit on 04/13/20  CMP14+EGFR   Collection Time: 04/13/20  8:52 AM  Result Value Ref Range   Glucose 92 65 - 99 mg/dL   BUN 8 6 - 20 mg/dL   Creatinine, Ser 9.09 0.76 - 1.27 mg/dL   GFR calc non Af Amer 122 >59 mL/min/1.73   GFR calc Af Amer 141 >59 mL/min/1.73   BUN/Creatinine Ratio 9 9 - 20   Sodium 141 134 - 144 mmol/L   Potassium 3.9 3.5 - 5.2 mmol/L   Chloride 102 96 - 106  mmol/L   CO2 21 20 - 29 mmol/L   Calcium 9.9 8.7 - 10.2 mg/dL   Total Protein 7.8 6.0 - 8.5 g/dL   Albumin 5.1 4.1 - 5.2 g/dL   Globulin, Total  2.7 1.5 - 4.5 g/dL   Albumin/Globulin Ratio 1.9 1.2 - 2.2   Bilirubin Total 0.4 0.0 - 1.2 mg/dL   Alkaline Phosphatase 72 44 - 121 IU/L   AST 16 0 - 40 IU/L   ALT 15 0 - 44 IU/L  CBC with Differential/Platelet   Collection Time: 04/13/20  8:52 AM  Result Value Ref Range   WBC 10.4 3.4 - 10.8 x10E3/uL   RBC 5.61 4.14 - 5.80 x10E6/uL   Hemoglobin 16.7 13.0 - 17.7 g/dL   Hematocrit 50.7 62.4 - 51.0 %   MCV 88 79 - 97 fL   MCH 29.8 26.6 - 33.0 pg   MCHC 33.9 31.5 - 35.7 g/dL   RDW 87.5 88.3 - 84.5 %   Platelets 118 (L) 150 - 450 x10E3/uL   Neutrophils 42 Not Estab. %   Lymphs 41 Not Estab. %   Monocytes 9 Not Estab. %   Eos 6 Not Estab. %   Basos 1 Not Estab. %   Neutrophils Absolute 4.5 1.4 - 7.0 x10E3/uL   Lymphocytes Absolute 4.3 (H) 0.7 - 3.1 x10E3/uL   Monocytes Absolute 0.9 0.1 - 0.9 x10E3/uL   EOS (ABSOLUTE) 0.6 (H) 0.0 - 0.4 x10E3/uL   Basophils Absolute 0.1 0.0 - 0.2 x10E3/uL   Immature Granulocytes 1 Not Estab. %   Immature Grans (Abs) 0.1 0.0 - 0.1 x10E3/uL   Hematology Comments: Note:   Thyroid Panel With TSH   Collection Time: 04/13/20  8:52 AM  Result Value Ref Range   TSH 3.870 0.450 - 4.500 uIU/mL   T4, Total 11.3 4.5 - 12.0 ug/dL   T3 Uptake Ratio 26 24 - 39 %   Free Thyroxine Index 2.9 1.2 - 4.9       Pertinent labs & imaging results that were available during my care of the patient were reviewed by me and considered in my medical decision making.  Assessment & Plan:  Phoenyx Paulsen was seen today for rash.  Diagnoses and all orders for this visit:  Moderate major depression (HCC)  Allergic contact dermatitis, unspecified trigger -     hydrOXYzine  (VISTARIL ) 25 MG capsule; Take 1 capsule (25 mg total) by mouth every 8 (eight) hours as needed for itching. -     predniSONE  (DELTASONE ) 20 MG tablet; Take 2  tablets (40 mg total) by mouth daily with breakfast for 5 days. -     famotidine  (PEPCID ) 40 MG tablet; Take 1 tablet (40 mg total) by mouth 2 (two) times daily for 14 days. -     cetirizine  (ZYRTEC ) 10 MG tablet; Take 1 tablet (10 mg total) by mouth at bedtime.     Allergic contact dermatitis, unspecified cause Rash present for approximately two weeks, located where the shirt rubs on the sides and under the armpits. No new known contacts, medications, or changes in laundry detergent. Likely allergic contact dermatitis, possibly due to an unknown allergen. No signs of systemic allergic reaction such as difficulty breathing, tongue swelling, or wheezing. - Prescribe prednisone  for five days. - Prescribe hydroxyzine  (Atarax ) as needed for pruritus. - Prescribe famotidine  (Pepcid ) twice daily for two weeks. - Prescribe cetirizine  (Zyrtec ) at night. - Recommend continued use of topical creams and suggest Equate eczema cream as a thick emollient and barrier repair. - Advise tracing potential environmental or product changes that may have caused the reaction. - Instruct to contact the clinic if symptoms worsen or change.  Anxiety and depression management Has not seen Doctor Dettinger for  anxiety and depression medications for years and has stopped taking them without consultation. Reports reduced anxiety and depression and does not feel the need for medications. - Advise to make an appointment with Doctor Dettinger for evaluation of anxiety and depression management.          Continue all other maintenance medications.  Follow up plan: Return if symptoms worsen or fail to improve.   Continue healthy lifestyle choices, including diet (rich in fruits, vegetables, and lean proteins, and low in salt and simple carbohydrates) and exercise (at least 30 minutes of moderate physical activity daily).  Educational handout given for contact dermatitis   The above assessment and management plan was  discussed with the patient. The patient verbalized understanding of and has agreed to the management plan. Patient is aware to call the clinic if they develop any new symptoms or if symptoms persist or worsen. Patient is aware when to return to the clinic for a follow-up visit. Patient educated on when it is appropriate to go to the emergency department.   Rosaline Bruns, FNP-C [facility] [phone]
